# Patient Record
Sex: Male | Born: 1980 | Race: Black or African American | Hispanic: No | Marital: Married | Smoking: Never smoker
Health system: Southern US, Community
[De-identification: ages and names within clinical notes are randomized; demographics above are authoritative.]

## PROBLEM LIST (undated history)

## (undated) DIAGNOSIS — K2100 Gastro-esophageal reflux disease with esophagitis, without bleeding: Secondary | ICD-10-CM

## (undated) DIAGNOSIS — A549 Gonococcal infection, unspecified: Secondary | ICD-10-CM

## (undated) DIAGNOSIS — U071 COVID-19: Secondary | ICD-10-CM

---

## 2020-02-08 DIAGNOSIS — Z20822 Contact with and (suspected) exposure to covid-19: Secondary | ICD-10-CM | POA: Diagnosis not present

## 2021-02-16 ENCOUNTER — Other Ambulatory Visit: Payer: Self-pay

## 2021-02-16 ENCOUNTER — Encounter (HOSPITAL_COMMUNITY): Payer: Self-pay

## 2021-02-16 ENCOUNTER — Ambulatory Visit (HOSPITAL_COMMUNITY)
Admission: EM | Admit: 2021-02-16 | Discharge: 2021-02-16 | Disposition: A | Discharge status: Home / Self Care | Payer: 59 | Attending: Internal Medicine | Admitting: Internal Medicine

## 2021-02-16 DIAGNOSIS — M79652 Pain in left thigh: Secondary | ICD-10-CM

## 2021-02-16 DIAGNOSIS — R5383 Other fatigue: Secondary | ICD-10-CM | POA: Insufficient documentation

## 2021-02-16 DIAGNOSIS — R300 Dysuria: Secondary | ICD-10-CM

## 2021-02-16 DIAGNOSIS — M7632 Iliotibial band syndrome, left leg: Secondary | ICD-10-CM | POA: Diagnosis present

## 2021-02-16 DIAGNOSIS — M79651 Pain in right thigh: Secondary | ICD-10-CM | POA: Insufficient documentation

## 2021-02-16 LAB — POCT URINALYSIS DIPSTICK, ED / UC
Bilirubin Urine: NEGATIVE
Glucose, UA: NEGATIVE mg/dL
Hgb urine dipstick: NEGATIVE
Ketones, ur: NEGATIVE mg/dL
Leukocytes,Ua: NEGATIVE
Nitrite: NEGATIVE
Protein, ur: NEGATIVE mg/dL
Specific Gravity, Urine: 1.02 (ref 1.005–1.030)
Urobilinogen, UA: 0.2 mg/dL (ref 0.0–1.0)
pH: 7 (ref 5.0–8.0)

## 2021-02-16 MED ORDER — NAPROXEN 500 MG PO TABS: 500.0000 mg | ORAL_TABLET | Freq: Two times a day (BID) | ORAL | 0 refills | Status: DC

## 2021-02-16 NOTE — ED Provider Notes (Signed)
[facility]   MRN: [id number] DOB: [birth year]  Subjective:   Hayden Snyder is a 40 y.o. male presenting for 1 year history of acute onset dysuria, occasional urinary straining.  Denies fever, hematuria, urinary frequency, urinary urgency.  Patient does not hydrate consistently.  He does drink about 3 cups of coffee daily.  No alcohol use.  No drug use.  No smoking.  Patient is sexually active with his wife only. Would like to have STI testing to make sure. His paternal grandfather had prostate problems. Patient also has persistent bilateral knee pain, thigh pain worse over left lateral thigh. 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  Denies any falls, trauma, swelling, bruising, history of arthritis or musculoskeletal disorders.  He is also had longstanding history of fatigue and weakness.  Does not have a PCP.  No current facility-administered medications for this encounter. No current outpatient medications on file.   No Known Allergies  History reviewed. No pertinent past medical history.   History reviewed. No pertinent surgical history.  Family History  Problem Relation Age of Onset   Hypertension Father    Diabetes Father     Social History   Tobacco Use   Smoking status: Never   Smokeless tobacco: Never  Substance Use Topics   Alcohol use: Not Currently   Drug use: Not Currently    ROS   Objective:   Vitals: BP 123/67 (BP Location: Right Arm)   Pulse (!) 57   Temp 98.5 F (36.9 C) (Oral)   Resp 18   SpO2 100%   Physical Exam Constitutional:      General: He is not in acute distress.    Appearance: Normal appearance. He is well-developed and normal weight. He is not ill-appearing, toxic-appearing or diaphoretic.  HENT:     Head: Normocephalic and atraumatic.     Right Ear: External ear normal.     Left Ear: External ear normal.     Nose: Nose normal.      Mouth/Throat:     Pharynx: Oropharynx is clear.  Eyes:     General: No scleral icterus.       Right eye: No discharge.        Left eye: No discharge.     Extraocular Movements: Extraocular movements intact.     Pupils: Pupils are equal, round, and reactive to light.  Cardiovascular:     Rate and Rhythm: Normal rate.  Pulmonary:     Effort: Pulmonary effort is normal.  Musculoskeletal:     Cervical back: Normal range of motion.  Skin:    General: Skin is warm and dry.  Neurological:     Mental Status: He is alert and oriented to person, place, and time.     Motor: No weakness.     Coordination: Coordination normal.     Gait: Gait normal.     Deep Tendon Reflexes: Reflexes normal.  Psychiatric:        Mood and Affect: Mood normal.        Behavior: Behavior normal.        Thought Content: Thought content normal.        Judgment: Judgment normal.    Results for orders placed or performed during the hospital encounter of 02/16/21 (from the past 24 hour(s))  POCT Urinalysis Dipstick (ED/UC)     Status: None   Collection Time:  02/16/21  7:08 PM  Result Value Ref Range   Glucose, UA NEGATIVE NEGATIVE mg/dL   Bilirubin Urine NEGATIVE NEGATIVE   Ketones, ur NEGATIVE NEGATIVE mg/dL   Specific Gravity, Urine 1.020 1.005 - 1.030   Hgb urine dipstick NEGATIVE NEGATIVE   pH 7.0 5.0 - 8.0   Protein, ur NEGATIVE NEGATIVE mg/dL   Urobilinogen, UA 0.2 0.0 - 1.0 mg/dL   Nitrite NEGATIVE NEGATIVE   Leukocytes,Ua NEGATIVE NEGATIVE    Assessment and Plan :   PDMP not reviewed this encounter.  1. Dysuria   2. Fatigue, unspecified type   3. Bilateral thigh pain   4. It band syndrome, left     Hold off on starting Flomax for BPH.  Recommended hydrating much more consistently with plain water, 2 L daily and cutting back on his coffee.  Needs follow-up with a new PCP, placed into the PCP assistance program.  Recommended naproxen for pain and inflammation, IT band syndrome secondary to the  nature of his work.  He will need labs and work-up for chronic fatigue and weakness.  Vital signs and physical exam findings stable for discharge. Counseled patient on potential for adverse effects with medications prescribed/recommended today, ER and return-to-clinic precautions discussed, patient verbalized understanding.    Wallis Bamberg, [place] 02/16/21 1017

## 2021-02-16 NOTE — ED Triage Notes (Addendum)
Pt c/o burning with urination and fatigue, with pain in both knees. Pt feels as though he has urinary obstruction. Reports sensation of incomplete bladder emptying.   Dysuria/retention started approx.one year ago per pt, knee pain began 5 months ago. Reports that the pain in the left knee extends up to thigh.

## 2021-02-17 LAB — CYTOLOGY, (ORAL, ANAL, URETHRAL) ANCILLARY ONLY
Chlamydia: NEGATIVE
Comment: NEGATIVE
Comment: NEGATIVE
Comment: NORMAL
Neisseria Gonorrhea: NEGATIVE
Trichomonas: NEGATIVE

## 2021-02-18 LAB — URINE CULTURE: Culture: <10000 — AB

## 2021-09-30 ENCOUNTER — Encounter (HOSPITAL_BASED_OUTPATIENT_CLINIC_OR_DEPARTMENT_OTHER): Payer: Self-pay | Admitting: Emergency Medicine

## 2021-09-30 ENCOUNTER — Emergency Department (HOSPITAL_BASED_OUTPATIENT_CLINIC_OR_DEPARTMENT_OTHER)
Admission: EM | Admit: 2021-09-30 | Discharge: 2021-09-30 | Disposition: A | Discharge status: Home / Self Care | Payer: Self-pay | Attending: Emergency Medicine | Admitting: Emergency Medicine

## 2021-09-30 ENCOUNTER — Other Ambulatory Visit: Payer: Self-pay

## 2021-09-30 ENCOUNTER — Emergency Department (HOSPITAL_BASED_OUTPATIENT_CLINIC_OR_DEPARTMENT_OTHER): Payer: Self-pay

## 2021-09-30 DIAGNOSIS — R338 Other retention of urine: Secondary | ICD-10-CM | POA: Diagnosis not present

## 2021-09-30 DIAGNOSIS — R109 Unspecified abdominal pain: Secondary | ICD-10-CM | POA: Insufficient documentation

## 2021-09-30 DIAGNOSIS — R339 Retention of urine, unspecified: Secondary | ICD-10-CM | POA: Insufficient documentation

## 2021-09-30 DIAGNOSIS — R350 Frequency of micturition: Secondary | ICD-10-CM | POA: Insufficient documentation

## 2021-09-30 DIAGNOSIS — R3911 Hesitancy of micturition: Secondary | ICD-10-CM | POA: Insufficient documentation

## 2021-09-30 DIAGNOSIS — R300 Dysuria: Secondary | ICD-10-CM | POA: Insufficient documentation

## 2021-09-30 LAB — URINALYSIS, ROUTINE W REFLEX MICROSCOPIC
Bilirubin Urine: NEGATIVE
Glucose, UA: NEGATIVE mg/dL
Hgb urine dipstick: NEGATIVE
Ketones, ur: NEGATIVE mg/dL
Leukocytes,Ua: NEGATIVE
Nitrite: NEGATIVE
Protein, ur: NEGATIVE mg/dL
Specific Gravity, Urine: 1.01 (ref 1.005–1.030)
pH: 5.5 (ref 5.0–8.0)

## 2021-09-30 LAB — CBC WITH DIFFERENTIAL/PLATELET
Abs Immature Granulocytes: 0.02 10*3/uL (ref 0.00–0.07)
Basophils Absolute: 0 10*3/uL (ref 0.0–0.1)
Basophils Relative: 0 %
Eosinophils Absolute: 0.2 10*3/uL (ref 0.0–0.5)
Eosinophils Relative: 2 %
HCT: 44.4 % (ref 39.0–52.0)
Hemoglobin: 14.4 g/dL (ref 13.0–17.0)
Immature Granulocytes: 0 %
Lymphocytes Relative: 21 %
Lymphs Abs: 1.7 10*3/uL (ref 0.7–4.0)
MCH: 29.4 pg (ref 26.0–34.0)
MCHC: 32.4 g/dL (ref 30.0–36.0)
MCV: 90.6 fL (ref 80.0–100.0)
Monocytes Absolute: 0.4 10*3/uL (ref 0.1–1.0)
Monocytes Relative: 5 %
Neutro Abs: 5.9 10*3/uL (ref 1.7–7.7)
Neutrophils Relative %: 72 %
Platelets: 164 10*3/uL (ref 150–400)
RBC: 4.9 MIL/uL (ref 4.22–5.81)
RDW: 13 % (ref 11.5–15.5)
WBC: 8.3 10*3/uL (ref 4.0–10.5)
nRBC: 0 % (ref 0.0–0.2)

## 2021-09-30 LAB — COMPREHENSIVE METABOLIC PANEL
ALT: 15 U/L (ref 0–44)
AST: 20 U/L (ref 15–41)
Albumin: 4.1 g/dL (ref 3.5–5.0)
Alkaline Phosphatase: 58 U/L (ref 38–126)
Anion gap: 8 (ref 5–15)
BUN: 10 mg/dL (ref 6–20)
CO2: 25 mmol/L (ref 22–32)
Calcium: 8.9 mg/dL (ref 8.9–10.3)
Chloride: 105 mmol/L (ref 98–111)
Creatinine, Ser: 0.78 mg/dL (ref 0.61–1.24)
GFR, Estimated: >60 mL/min (ref >60)
Glucose, Bld: 84 mg/dL (ref 70–99)
Potassium: 3.9 mmol/L (ref 3.5–5.1)
Sodium: 138 mmol/L (ref 135–145)
Total Bilirubin: 0.5 mg/dL (ref 0.3–1.2)
Total Protein: 7 g/dL (ref 6.5–8.1)

## 2021-09-30 MED ORDER — TAMSULOSIN HCL 0.4 MG PO CAPS: 0.4000 mg | ORAL_CAPSULE | Freq: Every day | ORAL | 0 refills | Status: AC

## 2021-09-30 MED ORDER — CEPHALEXIN 500 MG PO CAPS: 500.0000 mg | ORAL_CAPSULE | Freq: Two times a day (BID) | ORAL | 0 refills | Status: AC

## 2021-09-30 MED ORDER — OXYBUTYNIN CHLORIDE 5 MG PO TABS: 5.0000 mg | ORAL_TABLET | Freq: Three times a day (TID) | ORAL | 0 refills | Status: DC | PRN

## 2021-09-30 NOTE — ED Triage Notes (Signed)
Pt bladder scanned for 343 cc.

## 2021-09-30 NOTE — ED Notes (Signed)
Pt verbalizes understanding of discharge instructions. Opportunity for questioning and answers were provided. Pt discharged from ED to home. Discharged using interpreter ID [phone]

## 2021-09-30 NOTE — ED Provider Notes (Signed)
[facility] Provider Note   CSN: [id number] Arrival date & time: 09/30/21  1611     History  Chief Complaint  Patient presents with   Urinary Retention    Hayden Snyder is a 41 y.o. male.  The history is provided by the patient.  Male GU Problem Presenting symptoms: dysuria   Context: spontaneously   Worsened by:  Nothing Associated symptoms: urinary frequency, urinary hesitation and urinary retention   Associated symptoms: no abdominal pain, no diarrhea, no fever, no flank pain, no genital itching, no genital lesions, no genital rash, no groin pain, no hematuria, no nausea, no penile redness, no penile swelling, no priapism, no scrotal swelling, no urinary incontinence and no vomiting   Risk factors: no kidney stones and no urinary catheter       Home Medications Prior to Admission medications   Medication Sig Start Date End Date Taking? Authorizing Provider  cephALEXin (KEFLEX) 500 MG capsule Take 1 capsule (500 mg total) by mouth 2 (two) times daily for 5 days. 09/30/21 10/05/21 Yes Reginna Sermeno, DO  oxybutynin (DITROPAN) 5 MG tablet Take 1 tablet (5 mg total) by mouth every 8 (eight) hours as needed for up to 15 doses for bladder spasms. 09/30/21  Yes Ashanna Heinsohn, DO  tamsulosin (FLOMAX) 0.4 MG CAPS capsule Take 1 capsule (0.4 mg total) by mouth daily for 7 days. 09/30/21 10/07/21 Yes Cayden Rautio, DO  naproxen (NAPROSYN) 500 MG tablet Take 1 tablet (500 mg total) by mouth 2 (two) times daily with a meal. 02/16/21   Jaynee Eagles, PA-C      Allergies    Patient has no known allergies.    Review of Systems   Review of Systems  Constitutional:  Negative for fever.  Gastrointestinal:  Negative for abdominal pain, diarrhea, nausea and vomiting.  Genitourinary:  Positive for dysuria, frequency and hesitancy. Negative for bladder incontinence, flank pain, hematuria, penile swelling and scrotal swelling.   Physical Exam Updated Vital  Signs BP 104/76    Pulse 79    Temp 97.9 F (36.6 C)    Resp 18    SpO2 97%  Physical Exam Vitals and nursing note reviewed.  Constitutional:      General: He is not in acute distress.    Appearance: He is well-developed. He is not ill-appearing.  HENT:     Head: Normocephalic and atraumatic.     Nose: Nose normal.     Mouth/Throat:     Mouth: Mucous membranes are moist.  Eyes:     Extraocular Movements: Extraocular movements intact.     Conjunctiva/sclera: Conjunctivae normal.     Pupils: Pupils are equal, round, and reactive to light.  Cardiovascular:     Rate and Rhythm: Normal rate and regular rhythm.     Pulses: Normal pulses.     Heart sounds: Normal heart sounds. No murmur heard. Pulmonary:     Effort: Pulmonary effort is normal. No respiratory distress.     Breath sounds: Normal breath sounds.  Abdominal:     General: Abdomen is flat.     Palpations: Abdomen is soft.     Tenderness: There is abdominal tenderness.  Musculoskeletal:        General: No swelling.     Cervical back: Neck supple.  Skin:    General: Skin is warm and dry.     Capillary Refill: Capillary refill takes less than 2 seconds.  Neurological:     General: No focal deficit  present.     Mental Status: He is alert and oriented to person, place, and time.     Cranial Nerves: No cranial nerve deficit.     Sensory: No sensory deficit.     Motor: No weakness.     Coordination: Coordination normal.  Psychiatric:        Mood and Affect: Mood normal.    ED Results / Procedures / Treatments   Labs (all labs ordered are listed, but only abnormal results are displayed) Labs Reviewed  URINE CULTURE  URINALYSIS, ROUTINE W REFLEX MICROSCOPIC  CBC WITH DIFFERENTIAL/PLATELET  COMPREHENSIVE METABOLIC PANEL    EKG None  Radiology CT Renal Stone Study  Result Date: 09/30/2021 CLINICAL DATA:  Urinary retention. EXAM: CT ABDOMEN AND PELVIS WITHOUT CONTRAST TECHNIQUE: Multidetector CT imaging of the  abdomen and pelvis was performed following the standard protocol without IV contrast. RADIATION DOSE REDUCTION: This exam was performed according to the departmental dose-optimization program which includes automated exposure control, adjustment of the mA and/or kV according to patient size and/or use of iterative reconstruction technique. COMPARISON:  None. FINDINGS: Lower chest: No acute abnormality. Evaluation of the abdominal viscera is limited by the lack of IV contrast. Hepatobiliary: No focal liver abnormality is seen. Normal appearance of the gallbladder. Pancreas: Unremarkable. No surrounding inflammatory changes. Spleen: Normal in size without focal abnormality. Adrenals/Urinary Tract: Adrenal glands are unremarkable. Kidneys are normal, without renal calculi, focal lesion, or hydronephrosis. Bladder is unremarkable. Stomach/Bowel: Stomach is within normal limits. Appendix appears normal. No evidence of bowel wall thickening, distention, or inflammatory changes. Vascular/Lymphatic: No enlarged abdominal or pelvic lymph nodes. Reproductive: Prostate is unremarkable. Other: No abdominal wall hernia or abnormality. No abdominopelvic ascites. Musculoskeletal: Bilateral L5 pars defects.  No listhesis. IMPRESSION: 1. No acute process in the abdomen or pelvis on a noncontrast exam. 2. 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 Electronically Signed   By: Audie Pinto M.D.   On: 09/30/2021 18:14    Procedures Procedures    Medications Ordered in ED Medications - No data to display  ED Course/ Medical Decision Making/ A&P                           Medical Decision Making Amount and/or Complexity of Data Reviewed Labs: ordered. Radiology: ordered.  Risk Prescription drug management.   Hayden Snyder is here with pain with urination, difficulty urinating.  Normal vitals.  No fever.  No significant medical history.  Patient with difficulty with urination over the last day or 2  with burning sensation.  Denies any discharge or hematuria.  He had a bladder scan done in triage that showed around 300 cc of urine but postvoid residual with me showed less than 100 cc.  He is concerned about possibly prostate issue.  He is not having any back pain or any symptoms of cauda equina or stroke symptoms.  Neurologically he is intact.  He has some mild abdominal tenderness in the suprapubic region.  Overall this has never happened to him before.  Differential diagnosis includes cystitis versus bladder outlet obstruction from something such as BPH or mass.  We will get basic labs including CT scan of abdomen and pelvis.  Will check urinalysis.  Lab work was reviewed and interpreted.  There is no significant anemia, electrolyte abnormality, kidney injury.  Urinalysis does not appear to be consistent with UTI.  CT scan shows no obstructive process.  Prostate is not enlarged.  Neurologically he is intact.  I am not concerned for spinal cord problem or stroke.  He has no weakness or numbness throughout.  Overall he appears to be emptying his bladder well while being here but still has frequency type symptoms.  We will treat with oxybutynin, Flomax, Keflex and have him follow-up with urology if symptoms are persistent.  At this time he does not have any evidence of true retention and will hold off on Foley catheter but he understands return precautions.  Discharged in good condition.  Given information to follow-up with urology outpatient.  This chart was dictated using voice recognition software.  Despite best efforts to proofread,  errors can occur which can change the documentation meaning.         Final Clinical Impression(s) / ED Diagnoses Final diagnoses:  Urinary frequency    Rx / DC Orders ED Discharge Orders          Ordered    tamsulosin (FLOMAX) 0.4 MG CAPS capsule  Daily        09/30/21 1949    oxybutynin (DITROPAN) 5 MG tablet  Every 8 hours PRN        09/30/21 1949     cephALEXin (KEFLEX) 500 MG capsule  2 times daily        09/30/21 1949              Lennice Sites, DO 09/30/21 1952

## 2021-09-30 NOTE — ED Notes (Signed)
Pt states through interpreter that he has has painful urination for may years. Pt currently denis any pain except when he urinates which the last time was this morning.

## 2021-09-30 NOTE — Discharge Instructions (Addendum)
Take medications as prescribed.  Follow-up with urologist.  Please return if symptoms worsen as discussed.

## 2021-09-30 NOTE — ED Triage Notes (Signed)
Pt states he hasnt voided since yesterday. In severe pain at this time. This has never happened before. Pt denies any medical history. Used Interpreter Ipad , Arabic.

## 2021-10-02 LAB — URINE CULTURE: Culture: NO GROWTH

## 2021-10-05 DIAGNOSIS — R350 Frequency of micturition: Secondary | ICD-10-CM | POA: Diagnosis not present

## 2021-10-05 DIAGNOSIS — R3912 Poor urinary stream: Secondary | ICD-10-CM | POA: Diagnosis not present

## 2021-10-05 DIAGNOSIS — R102 Pelvic and perineal pain: Secondary | ICD-10-CM | POA: Diagnosis not present

## 2021-11-06 DIAGNOSIS — R3912 Poor urinary stream: Secondary | ICD-10-CM | POA: Diagnosis not present

## 2021-11-06 DIAGNOSIS — R102 Pelvic and perineal pain: Secondary | ICD-10-CM | POA: Diagnosis not present

## 2021-11-06 DIAGNOSIS — R350 Frequency of micturition: Secondary | ICD-10-CM | POA: Diagnosis not present

## 2022-02-09 DIAGNOSIS — R3912 Poor urinary stream: Secondary | ICD-10-CM | POA: Diagnosis not present

## 2022-02-09 DIAGNOSIS — N35011 Post-traumatic bulbous urethral stricture: Secondary | ICD-10-CM | POA: Diagnosis not present

## 2022-02-19 ENCOUNTER — Other Ambulatory Visit: Payer: Self-pay | Admitting: Urology

## 2022-02-26 ENCOUNTER — Encounter (HOSPITAL_BASED_OUTPATIENT_CLINIC_OR_DEPARTMENT_OTHER): Payer: Self-pay | Admitting: Urology

## 2022-02-26 ENCOUNTER — Other Ambulatory Visit: Payer: Self-pay

## 2022-02-26 NOTE — Progress Notes (Signed)
Spoke w/ via phone for pre-op interview---pt and arabic interpreter Lab needs dos---- none              Lab results------none COVID test -----patient states asymptomatic no test needed Arrive at -------0530 NPO after MN NO Solid Food.  Clear liquids from MN until--- Med rec completed Medications to take morning of surgery -----none Diabetic medication -----n/a Patient instructed no nail polish to be worn day of surgery Patient instructed to bring photo id and insurance card day of surgery Patient aware to have Driver (ride ) / caregiver   will let know day of surgery  for 24 hours after surgery  Patient Special Instructions -----no food and drinks after midnight Pre-Op special Istructions ----- Patient verbalized understanding of instructions that were given at this phone interview. Patient denies shortness of breath, chest pain, fever, cough at this phone interview.    Used arabic interpreter # [id number]  Pt doesn't know who will pick him up or stay with him 24 hrs after surgery, told if he doesn't have anyone his surgery will be cancelled, told him this twice by interpreter.    Interpreter requested DOS

## 2022-03-01 NOTE — H&P (Signed)
Office Visit Report     02/09/2022   --------------------------------------------------------------------------------   Hayden Snyder  MRN: [id number]  DOB: 01-17-1981, 41 year old Male  SSN:    PRIMARY CARE:    REFERRING:    PROVIDER:  Jerilee Field, M.D.  LOCATION:  [facility] [phone]     --------------------------------------------------------------------------------   CC/HPI: F/u -    1) BPH - LUTS - he had SP discomfort starting Feb 2023. He had this years ago. He had urinary hesitancy and weak stream. He went to [facility] September 30, 2021. Urine was clear. Urine culture was negative. White count 8.3, creatinine 0.78. CT scan of the abdomen and pelvis was done September 30, 2021 showed a benign GU tract without bladder distention. no hydro. He started cephalexin, tamsulosin and oxybutynin. He has frequency for many years. Tams and oxyb have helped some.   He had "bladder" pain again today - SP area. He has left leg/hip pain. Radiates down the leg - like "sciatica". He does factory work and stands a lot. No constipation. No prior GU surgery. No NG risk.   PVR Feb 2023 73 ml. He was on tamsulosin. He was prescribed oxybutynin and NSAIDs in February 2023. His UA is clear. His dysuria is better. He continues to have weak stream and mild dysuria. PVR 28 ml. UA clear. He elects to proceed with cystoscopy.     ALLERGIES: No Allergies    MEDICATIONS: Oxybutynin Chloride 5 mg tablet 1 tablet PO Q AM  Oxybutynin Chloride 5 mg tablet  Tamsulosin Hcl 0.4 mg capsule 1 capsule PO Q HS  Tamsulosin Hcl 0.4 mg capsule     GU PSH: No GU PSH    NON-GU PSH: No Non-GU PSH    GU PMH: Pelvic/perineal pain - 11/06/2021, Do a short course of meloxicam. Your CT, labs, exam and normal. Likely MSK. I recommended he find a PCP so that he can establish. He may need more imaging in future or referral to orthopedics. , - 10/05/2021 Urinary Frequency - 11/06/2021, continue  tamsulosin QHS and oxybutynin QAM. Consider a variety of treatments - PT, may need UDS and cystoscopy - discussed the role of these procedures. , - 10/05/2021 Weak Urinary Stream - 11/06/2021, - 10/05/2021    NON-GU PMH: No Non-GU PMH    FAMILY HISTORY: No Family History    SOCIAL HISTORY: No Social History    REVIEW OF SYSTEMS:    GU Review Male:   Patient denies frequent urination, hard to postpone urination, burning/ pain with urination, get up at night to urinate, leakage of urine, stream starts and stops, trouble starting your stream, have to strain to urinate , erection problems, and penile pain.  Gastrointestinal (Upper):   Patient denies nausea, vomiting, and indigestion/ heartburn.  Gastrointestinal (Lower):   Patient denies diarrhea and constipation.  Constitutional:   Patient denies fever, night sweats, weight loss, and fatigue.  Skin:   Patient denies skin rash/ lesion and itching.  Eyes:   Patient denies blurred vision and double vision.  Ears/ Nose/ Throat:   Patient denies sore throat and sinus problems.  Hematologic/Lymphatic:   Patient denies swollen glands and easy bruising.  Cardiovascular:   Patient denies chest pains and leg swelling.  Respiratory:   Patient denies cough and shortness of breath.  Endocrine:   Patient denies excessive thirst.  Musculoskeletal:   Patient denies back pain and joint pain.  Neurological:   Patient denies headaches and dizziness.  Psychologic:  Patient denies depression and anxiety.   VITAL SIGNS: None   GU PHYSICAL EXAMINATION:    Scrotum: No lesions. No edema. No cysts. No warts.  Urethral Meatus: Normal size. No lesion, no wart, no discharge, no polyp. Normal location.  Penis: Circumcised, no warts, no cracks. No dorsal Peyronie's plaques, no left corporal Peyronie's plaques, no right corporal Peyronie's plaques, no scarring, no warts. No balanitis, no meatal stenosis.   MULTI-SYSTEM PHYSICAL EXAMINATION:    Constitutional:  Well-nourished. No physical deformities. Normally developed. Good grooming.  Neck: Neck symmetrical, not swollen. Normal tracheal position.  Respiratory: No labored breathing, no use of accessory muscles.   Cardiovascular: Normal temperature, normal extremity pulses, no swelling, no varicosities.  Skin: No paleness, no jaundice, no cyanosis. No lesion, no ulcer, no rash.  Neurologic / Psychiatric: Oriented to time, oriented to place, oriented to person. No depression, no anxiety, no agitation.  Gastrointestinal: No mass, no tenderness, no rigidity, non obese abdomen.     PAST DATA REVIEW: None   PROCEDURES:         Flexible Cystoscopy - 52000  Risks, benefits, and some of the potential complications of the procedure were discussed with the patient. All questions were answered. Informed consent was obtained. Antibiotic prophylaxis was given -- Cephalexin. Sterile technique and intraurethral analgesia were used.  Meatus:  Normal size. Normal location. Normal condition.  Urethra:  Severe bulbous stricture - 8 Fr. I can see through it it seems its about a centimeter long. I showed Ky the stricture on the screen compared to the normal caliber urethra.       The lower urinary tract was carefully examined. The procedure was well-tolerated and without complications. Antibiotic instructions were given. Instructions were given to call the office immediately for bloody urine, difficulty urinating, painful urination, fever, chills, nausea, vomiting or other illness. The patient stated that he understood these instructions and would comply with them.        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 We discussed continued surveillance as his urine is clear and he is getting his bladder empty. We discussed formal urethroplasty. All questions answered and he will proceed with balloon dilation. We discussed postop Foley.  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 We discussed this may not alleviate his symptoms but I was suspicious of something like this given his description.   PLAN:            Medications Stop Meds: Cephalexin 500 mg capsule  Discontinue: 02/09/2022  - Reason: The medication cycle was completed.            Schedule Return Visit/Planned Activity: Next Available Appointment - Schedule Surgery          Document Letter(s):  Created for Patient: Clinical Summary    * Signed by Jerilee Field, M.D. on 02/12/22 at 12:45 PM (EDT)*  The information contained in this medical record document is considered private and confidential patient information. This information can only be used for the medical diagnosis and/or medical services that are being provided by the patient's selected caregivers. This information can only be distributed outside of the patient's care if the patient agrees and signs waivers of authorization for this information to be sent to an outside source or route.

## 2022-03-02 ENCOUNTER — Encounter (HOSPITAL_BASED_OUTPATIENT_CLINIC_OR_DEPARTMENT_OTHER): Payer: Self-pay | Admitting: Urology

## 2022-03-02 ENCOUNTER — Encounter (HOSPITAL_BASED_OUTPATIENT_CLINIC_OR_DEPARTMENT_OTHER)
Admission: RE | Disposition: A | Discharge status: Home / Self Care | Payer: Self-pay | Source: Home / Self Care | Attending: Urology

## 2022-03-02 ENCOUNTER — Ambulatory Visit (HOSPITAL_BASED_OUTPATIENT_CLINIC_OR_DEPARTMENT_OTHER)
Admission: RE | Admit: 2022-03-02 | Discharge: 2022-03-02 | Disposition: A | Discharge status: Home / Self Care | Payer: BC Managed Care – PPO | Attending: Urology | Admitting: Urology

## 2022-03-02 ENCOUNTER — Ambulatory Visit (HOSPITAL_BASED_OUTPATIENT_CLINIC_OR_DEPARTMENT_OTHER): Payer: BC Managed Care – PPO | Admitting: Anesthesiology

## 2022-03-02 DIAGNOSIS — Z79899 Other long term (current) drug therapy: Secondary | ICD-10-CM | POA: Diagnosis not present

## 2022-03-02 DIAGNOSIS — R350 Frequency of micturition: Secondary | ICD-10-CM | POA: Insufficient documentation

## 2022-03-02 DIAGNOSIS — R3912 Poor urinary stream: Secondary | ICD-10-CM | POA: Insufficient documentation

## 2022-03-02 DIAGNOSIS — N35812 Other urethral bulbous stricture, male: Secondary | ICD-10-CM

## 2022-03-02 DIAGNOSIS — N401 Enlarged prostate with lower urinary tract symptoms: Secondary | ICD-10-CM | POA: Insufficient documentation

## 2022-03-02 DIAGNOSIS — N35919 Unspecified urethral stricture, male, unspecified site: Secondary | ICD-10-CM | POA: Diagnosis not present

## 2022-03-02 HISTORY — DX: Gonococcal infection, unspecified: A54.9

## 2022-03-02 HISTORY — DX: COVID-19: U07.1

## 2022-03-02 HISTORY — PX: BALLOON DILATION: SHX5330

## 2022-03-02 HISTORY — DX: Gastro-esophageal reflux disease with esophagitis, without bleeding: K21.00

## 2022-03-02 SURGERY — BALLOON DILATION
Anesthesia: General | Site: Urethra

## 2022-03-02 MED ORDER — CEFAZOLIN SODIUM-DEXTROSE 2-4 GM/100ML-% IV SOLN
INTRAVENOUS | Status: AC
  Filled 2022-03-02: qty 100

## 2022-03-02 MED ORDER — OXYBUTYNIN CHLORIDE 5 MG PO TABS: 5.0000 mg | ORAL_TABLET | Freq: Three times a day (TID) | ORAL | 0 refills | Status: DC | PRN

## 2022-03-02 MED ORDER — STERILE WATER FOR IRRIGATION IR SOLN
Status: DC | PRN
  Administered 2022-03-02: 500 mL

## 2022-03-02 MED ORDER — MIDAZOLAM HCL 2 MG/2ML IJ SOLN
INTRAMUSCULAR | Status: DC | PRN
  Administered 2022-03-02: 2 mg via INTRAVENOUS

## 2022-03-02 MED ORDER — PROPOFOL 10 MG/ML IV BOLUS
INTRAVENOUS | Status: AC
  Filled 2022-03-02: qty 20

## 2022-03-02 MED ORDER — OXYCODONE HCL 5 MG PO TABS
ORAL_TABLET | ORAL | Status: AC
  Filled 2022-03-02: qty 1

## 2022-03-02 MED ORDER — ONDANSETRON HCL 4 MG/2ML IJ SOLN
INTRAMUSCULAR | Status: DC | PRN
  Administered 2022-03-02: 4 mg via INTRAVENOUS

## 2022-03-02 MED ORDER — LIDOCAINE 2% (20 MG/ML) 5 ML SYRINGE
INTRAMUSCULAR | Status: DC | PRN
  Administered 2022-03-02: 60 mg via INTRAVENOUS

## 2022-03-02 MED ORDER — AMISULPRIDE (ANTIEMETIC) 5 MG/2ML IV SOLN: 10.0000 mg | Freq: Once | INTRAVENOUS | Status: DC | PRN

## 2022-03-02 MED ORDER — OXYCODONE HCL 5 MG PO TABS
5.0000 mg | ORAL_TABLET | Freq: Once | ORAL | Status: AC | PRN
  Administered 2022-03-02: 5 mg via ORAL

## 2022-03-02 MED ORDER — HYDROCODONE-ACETAMINOPHEN 5-325 MG PO TABS
1.0000 | ORAL_TABLET | ORAL | 0 refills | Status: AC | PRN
Start: 2022-03-02 — End: 2023-03-02

## 2022-03-02 MED ORDER — HYDROMORPHONE HCL 1 MG/ML IJ SOLN: 0.2500 mg | INTRAMUSCULAR | Status: DC | PRN

## 2022-03-02 MED ORDER — CEFAZOLIN SODIUM-DEXTROSE 2-4 GM/100ML-% IV SOLN
2.0000 g | INTRAVENOUS | Status: AC
  Administered 2022-03-02: 2 g via INTRAVENOUS

## 2022-03-02 MED ORDER — CEPHALEXIN 500 MG PO CAPS: 500.0000 mg | ORAL_CAPSULE | Freq: Every day | ORAL | 0 refills | Status: DC

## 2022-03-02 MED ORDER — MEPERIDINE HCL 25 MG/ML IJ SOLN: 6.2500 mg | INTRAMUSCULAR | Status: DC | PRN

## 2022-03-02 MED ORDER — OXYCODONE HCL 5 MG/5ML PO SOLN: 5.0000 mg | Freq: Once | ORAL | Status: AC | PRN

## 2022-03-02 MED ORDER — FENTANYL CITRATE (PF) 100 MCG/2ML IJ SOLN
INTRAMUSCULAR | Status: AC
  Filled 2022-03-02: qty 2

## 2022-03-02 MED ORDER — MIDAZOLAM HCL 2 MG/2ML IJ SOLN
INTRAMUSCULAR | Status: AC
  Filled 2022-03-02: qty 2

## 2022-03-02 MED ORDER — FENTANYL CITRATE (PF) 250 MCG/5ML IJ SOLN
INTRAMUSCULAR | Status: DC | PRN
  Administered 2022-03-02: 50 ug via INTRAVENOUS

## 2022-03-02 MED ORDER — IOHEXOL 300 MG/ML  SOLN
INTRAMUSCULAR | Status: DC | PRN
  Administered 2022-03-02: 50 mL

## 2022-03-02 MED ORDER — PROPOFOL 10 MG/ML IV BOLUS
INTRAVENOUS | Status: DC | PRN
  Administered 2022-03-02: 200 mg via INTRAVENOUS

## 2022-03-02 MED ORDER — DEXAMETHASONE SODIUM PHOSPHATE 10 MG/ML IJ SOLN
INTRAMUSCULAR | Status: DC | PRN
  Administered 2022-03-02: 10 mg via INTRAVENOUS

## 2022-03-02 MED ORDER — PROMETHAZINE HCL 25 MG/ML IJ SOLN: 6.2500 mg | INTRAMUSCULAR | Status: DC | PRN

## 2022-03-02 MED ORDER — LACTATED RINGERS IV SOLN: INTRAVENOUS | Status: DC

## 2022-03-02 SURGICAL SUPPLY — 26 items
BAG DRAIN URO-CYSTO SKYTR STRL (DRAIN) ×2 IMPLANT
BAG URINE DRAIN 2000ML AR STRL (UROLOGICAL SUPPLIES) ×1 IMPLANT
BALLN NEPHROSTOMY (BALLOONS)
BALLN OPTILUME DCB 30X3X75 (BALLOONS)
BALLN OPTILUME DCB 30X5X75 (BALLOONS) ×2
BALLOON NEPHROSTOMY (BALLOONS) IMPLANT
BALLOON OPTILUME DCB 30X3X75 (BALLOONS) IMPLANT
BALLOON OPTILUME DCB 30X5X75 (BALLOONS) IMPLANT
CATH FOLEY 2W COUNCIL 5CC 16FR (CATHETERS) ×1 IMPLANT
CATH FOLEY 2W COUNCIL 5CC 18FR (CATHETERS) ×1 IMPLANT
CATH SET URETHRAL DILATOR (CATHETERS) ×2 IMPLANT
CLOTH BEACON ORANGE TIMEOUT ST (SAFETY) ×2 IMPLANT
ELECT REM PT RETURN 9FT ADLT (ELECTROSURGICAL)
ELECTRODE REM PT RTRN 9FT ADLT (ELECTROSURGICAL) IMPLANT
GLOVE BIO SURGEON STRL SZ7.5 (GLOVE) ×2 IMPLANT
GLOVE BIO SURGEON STRL SZ8 (GLOVE) IMPLANT
GOWN STRL REUS W/TWL LRG LVL3 (GOWN DISPOSABLE) ×2 IMPLANT
GUIDEWIRE ANG ZIPWIRE 038X150 (WIRE) IMPLANT
GUIDEWIRE STR DUAL SENSOR (WIRE) ×1 IMPLANT
HOLDER FOLEY CATH W/STRAP (MISCELLANEOUS) ×1 IMPLANT
IV NS IRRIG 3000ML ARTHROMATIC (IV SOLUTION) ×1 IMPLANT
KIT TURNOVER CYSTO (KITS) ×2 IMPLANT
MANIFOLD NEPTUNE II (INSTRUMENTS) ×2 IMPLANT
NS IRRIG 500ML POUR BTL (IV SOLUTION) ×1 IMPLANT
PACK CYSTO (CUSTOM PROCEDURE TRAY) ×2 IMPLANT
TUBE CONNECTING 12X1/4 (SUCTIONS) ×1 IMPLANT

## 2022-03-02 NOTE — Discharge Instructions (Addendum)
Urethral Dilation  Urethral dilation is a procedure to stretch open (dilate) the urethra. The urethra is the tube that drains urine from the bladder out of the body. In women, the urethra opens above the vaginal opening. In men, the urethra opens at the tip of the penis. Urethral dilation is usually done to treat narrowing of the urethra (urethral stricture), which can make it difficult to pass urine. Urethral dilation widens the urethra so that you can pass urine normally. Urethral dilation is done through the urethral opening. There are no incisions made during the procedure.  What can I expect after the procedure? After the procedure, it is common to have: Burning pain when urinating. Blood in your urine. A need to urinate frequently. You will be asked to urinate before you leave the hospital or clinic. Your urine flow should improve within a few days. Use a condom for 30 days during sexual intercourse and use other appropriate contraception to prevent pregnancy for 6 months after the procedure  Follow these instructions at home: Medicines Take over-the-counter and prescription medicines only as told by your health care provider. If you were prescribed an antibiotic medicine, take it as told by your health care provider. Do not stop taking the antibiotic even if you start to feel better. Ask your health care provider if the medicine prescribed to you: Requires you to avoid driving or using heavy machinery. Can cause constipation. You may need to take these actions to prevent or treat constipation: Take over-the-counter or prescription medicines. Eat foods that are high in fiber, such as beans, whole grains, and fresh fruits and vegetables. Limit foods that are high in fat and processed sugars, such as fried or sweet foods. General instructions Do not drive for 24 hours if you were given a sedative during your procedure. If you were sent home with a small, lubricated tube (catheter) to  help keep your urethra open, follow your health care provider's instructions about how and when to use it. Drink enough fluid to keep your urine pale yellow. Return to your normal activities as told by your health care provider. Ask your health care provider what activities are safe for you. Keep all follow-up visits as told by your health care provider. This is important. Contact a health care provider if: Your urine is cloudy and smells bad. You develop new bleeding when you urinate. You pass blood clots when you urinate. You have pain that does not get better with medicine. You have a fever. You have swelling, bruising, or discoloration of your genital area. This includes the penis, scrotum, and inner thighs for men, and the outer genital organs (vulva) and inner thighs for women. Get help right away if: You develop new bleeding that does not stop. You cannot pass urine. Summary Urethral dilation is a procedure to stretch open (dilate) the urethra. Urethral dilation is usually done to treat narrowing of the urethra (urethral stricture), which can make it difficult to pass urine. Ask your health care provider about changing or stopping your regular medicines before the procedure. After the procedure, it is common to have burning pain when urinating, blood in your urine, and a need to urinate frequently. This information is not intended to replace advice given to you by your health care provider. Make sure you discuss any questions you have with your health care provider. Document Revised: 09/18/2018 Document Reviewed: 09/18/2018 Elsevier Patient Education  2023 [employer] Post Anesthesia Home Care Instructions  Activity: Get plenty of rest  for the remainder of the day. A responsible individual must stay with you for 24 hours following the procedure.  For the next 24 hours, DO NOT: -Drive a car -Advertising copywriter -Drink alcoholic beverages -Take any medication unless instructed by  your physician -Make any legal decisions or sign important papers.  Meals: Start with liquid foods such as gelatin or soup. Progress to regular foods as tolerated. Avoid greasy, spicy, heavy foods. If nausea and/or vomiting occur, drink only clear liquids until the nausea and/or vomiting subsides. Call your physician if vomiting continues.  Special Instructions/Symptoms: Your throat may feel dry or sore from the anesthesia or the breathing tube placed in your throat during surgery. If this causes discomfort, gargle with warm salt water. The discomfort should disappear within 24 hours.

## 2022-03-02 NOTE — Anesthesia Preprocedure Evaluation (Signed)
Anesthesia Evaluation  Patient identified by MRN, date of birth, ID band Patient awake    Reviewed: Allergy & Precautions, NPO status , Patient's Chart, lab work & pertinent test results  Airway Mallampati: II  TM Distance: >3 FB Neck ROM: Full    Dental no notable dental hx.    Pulmonary neg pulmonary ROS,    Pulmonary exam normal breath sounds clear to auscultation       Cardiovascular negative cardio ROS Normal cardiovascular exam Rhythm:Regular Rate:Normal     Neuro/Psych negative neurological ROS  negative psych ROS   GI/Hepatic negative GI ROS, Neg liver ROS,   Endo/Other  negative endocrine ROS  Renal/GU negative Renal ROS  negative genitourinary   Musculoskeletal negative musculoskeletal ROS (+)   Abdominal   Peds negative pediatric ROS (+)  Hematology negative hematology ROS (+)   Anesthesia Other Findings BPH  Reproductive/Obstetrics negative OB ROS                             Anesthesia Physical Anesthesia Plan  ASA: 2  Anesthesia Plan: General   Post-op Pain Management: Minimal or no pain anticipated   Induction: Intravenous  PONV Risk Score and Plan: 2 and Ondansetron, Midazolam and Treatment may vary due to age or medical condition  Airway Management Planned: LMA  Additional Equipment:   Intra-op Plan:   Post-operative Plan: Extubation in OR  Informed Consent: I have reviewed the patients History and Physical, chart, labs and discussed the procedure including the risks, benefits and alternatives for the proposed anesthesia with the patient or authorized representative who has indicated his/her understanding and acceptance.     Dental advisory given  Plan Discussed with: CRNA  Anesthesia Plan Comments:         Anesthesia Quick Evaluation

## 2022-03-02 NOTE — Anesthesia Procedure Notes (Signed)
Procedure Name: LMA Insertion Date/Time: 03/02/2022 7:41 AM  Performed by: Dairl Ponder, CRNAPre-anesthesia Checklist: Patient identified, Emergency Drugs available, Suction available and Patient being monitored Patient Re-evaluated:Patient Re-evaluated prior to induction Oxygen Delivery Method: Circle System Utilized Preoxygenation: Pre-oxygenation with 100% oxygen Induction Type: IV induction Ventilation: Mask ventilation without difficulty LMA: LMA inserted LMA Size: 4.0 Number of attempts: 1 Airway Equipment and Method: Bite block Placement Confirmation: positive ETCO2 Tube secured with: Tape Dental Injury: Teeth and Oropharynx as per pre-operative assessment

## 2022-03-02 NOTE — Interval H&P Note (Signed)
History and Physical Interval Note:  03/02/2022 7:21 AM  Hayden Snyder  has presented today for surgery, with the diagnosis of URETHRAL STRICTURE.  The various methods of treatment have been discussed with the patient and family. After consideration of risks, benefits and other options for treatment, the patient has consented to  Procedure(s) with comments: OPTILUM BALLOON DILATION (N/A) - 43 MINS as a surgical intervention.  The patient's history has been reviewed, patient examined, no change in status, stable for surgery.  I have reviewed the patient's chart and labs.  He is well today.  I met with him in the preop area with the interpreter.  We went over again the nature risk benefits and alternatives to the OptiLube balloon including continued surveillance or urethroplasty.  We discussed common recurrence after balloon dilation.  We discussed flow symptoms typically improve but things like dysuria frequency and urgency can persist and that symptom we can deal with.  We discussed rare risk of fistula or incontinence among others.  We also discussed restrictions with condom for 30 days and contraception for 6 months.  We discussed the postop care and follow-up.  Foley care as well.  Questions were answered to the patient's satisfaction.  He elects to proceed.   Festus Aloe

## 2022-03-02 NOTE — Transfer of Care (Signed)
Immediate Anesthesia Transfer of Care Note  Patient: Hayden Snyder  Procedure(s) Performed: OPTILUM BALLOON DILATION (Urethra)  Patient Location: PACU  Anesthesia Type:General  Level of Consciousness: drowsy  Airway & Oxygen Therapy: Patient Spontanous Breathing  Post-op Assessment: Report given to RN and Post -op Vital signs reviewed and stable  Post vital signs: Reviewed and stable  Last Vitals:  Vitals Value Taken Time  BP 118/74 03/02/22 0827  Temp    Pulse    Resp 15 03/02/22 0830  SpO2    Vitals shown include unvalidated device data.  Last Pain:  Vitals:   03/02/22 0609  TempSrc: Oral  PainSc: 0-No pain      Patients Stated Pain Goal: 5 (03/02/22 6754)  Complications: No notable events documented.

## 2022-03-02 NOTE — Op Note (Signed)
Preoperative diagnosis: Urethral stricture Postoperative diagnosis: Same  Procedure: OptiLume balloon urethral stricture dilation  Surgeon: Mena Goes  Assistant: Gillian Scarce  Anesthesia: General  Indication for procedure: Hayden Snyder is a 41 year old male that had a history of weak stream and dysuria.  A tight stricture was noted on cystoscopy.  Findings: On exam the penis was circumcised without mass or lesion.  Testicles descended bilaterally and palpably normal.  On cystoscopy the penile urethra was a bit tight on the 22 [place] scope and there was a focal  2 cm bulbar urethral stricture about 8 [place].  Otherwise the prostatic urethra and bladder were unremarkable.  No stone or foreign body in the bladder.  No obvious mucosal lesion.  On dilation of the stricture with the dilators and the balloon it was quite dense and tight.  Description of procedure: After consent was obtained patient brought to the operating room.  He is placed in lithotomy position and prepped and draped in the usual sterile fashion.  Timeout was performed confirm the patient and procedure.  The rigid cystoscope was passed per urethra but was a bit tight so I swapped it out for the flexible cystoscope.  Focal stricture was noted in the bulbar urethra guidewire was passed and coiled in the bladder under fluoroscopic guidance.  We then serially dilated with a straight Bard tight urethral dilators from 12-18 [place].  We were then able to pass the flexible cystoscope along the wire through the stricture and inspect the proximal urethra, prostatic urethra and bladder.  We then came back and measured the stricture in the bulb at about 2 cm.  A 5 cm 30 [place] OPTi lume balloon was chosen and it was placed in the urethra over the guidewire to presoaked.  We followed it in with the flexible cystoscope.  It was then seated correctly over the urethral stricture and inflated.  Thought it might be slightly deep and therefore the balloon was deflated  backed out a few millimeters and then reinflated.  Visually and under fluoroscopic guidance a look like we had good stricture coverage.  The balloon was left inflated for 5 minutes and then deflated.  The balloon was removed without difficulty and then an 54 French Foley catheter was placed in left to gravity drainage.  Irrigation was clear and the catheter was in good position under fluoroscopic guidance.  He was then awakened taken recovery room in stable condition.  Complications: None  Blood loss: Minimal  Specimens: None  Drains: 18 French Foley catheter  Disposition: Patient stable to PACU -I discussed the procedure, follow-up and postop care with the patient's wife and cousin with the interpreter in the consultation room.

## 2022-03-02 NOTE — Anesthesia Postprocedure Evaluation (Signed)
Anesthesia Post Note  Patient: Deyon Alssir Moha Kulpa  Procedure(s) Performed: OPTILUM BALLOON DILATION (Urethra)     Patient location during evaluation: PACU Anesthesia Type: General Level of consciousness: awake and alert Pain management: pain level controlled Vital Signs Assessment: post-procedure vital signs reviewed and stable Respiratory status: spontaneous breathing, nonlabored ventilation and respiratory function stable Cardiovascular status: blood pressure returned to baseline and stable Postop Assessment: no apparent nausea or vomiting Anesthetic complications: no   No notable events documented.  Last Vitals:  Vitals:   03/02/22 0915 03/02/22 0935  BP: 118/79 116/78  Pulse:  67  Resp: 14 16  Temp: (!) 36.4 C 36.5 C  SpO2: 98% 97%    Last Pain:  Vitals:   03/02/22 1015  TempSrc:   PainSc: 4                  Lowella Curb

## 2022-03-05 ENCOUNTER — Encounter (HOSPITAL_BASED_OUTPATIENT_CLINIC_OR_DEPARTMENT_OTHER): Payer: Self-pay | Admitting: Urology

## 2022-03-06 DIAGNOSIS — N35011 Post-traumatic bulbous urethral stricture: Secondary | ICD-10-CM | POA: Diagnosis not present

## 2022-04-06 DIAGNOSIS — N35011 Post-traumatic bulbous urethral stricture: Secondary | ICD-10-CM | POA: Diagnosis not present

## 2022-06-17 DIAGNOSIS — M26609 Unspecified temporomandibular joint disorder, unspecified side: Secondary | ICD-10-CM | POA: Diagnosis not present

## 2022-06-17 DIAGNOSIS — H6121 Impacted cerumen, right ear: Secondary | ICD-10-CM | POA: Diagnosis not present

## 2022-06-22 ENCOUNTER — Emergency Department (HOSPITAL_COMMUNITY)
Admission: EM | Admit: 2022-06-22 | Discharge: 2022-06-22 | Disposition: A | Discharge status: Home / Self Care | Payer: BC Managed Care – PPO | Attending: Emergency Medicine | Admitting: Emergency Medicine

## 2022-06-22 ENCOUNTER — Other Ambulatory Visit: Payer: Self-pay

## 2022-06-22 DIAGNOSIS — R220 Localized swelling, mass and lump, head: Secondary | ICD-10-CM | POA: Diagnosis not present

## 2022-06-22 DIAGNOSIS — G510 Bell's palsy: Secondary | ICD-10-CM | POA: Insufficient documentation

## 2022-06-22 MED ORDER — PREDNISONE 10 MG PO TABS: 60.0000 mg | ORAL_TABLET | Freq: Every day | ORAL | 0 refills | Status: AC

## 2022-06-22 MED ORDER — VALACYCLOVIR HCL 1 G PO TABS: 1000.0000 mg | ORAL_TABLET | Freq: Three times a day (TID) | ORAL | 0 refills | Status: AC

## 2022-07-26 DIAGNOSIS — N35011 Post-traumatic bulbous urethral stricture: Secondary | ICD-10-CM | POA: Diagnosis not present

## 2023-03-03 IMAGING — CT CT RENAL STONE PROTOCOL
2 of 4 series · 17 of 46 positions shown, 19 images · non-contrast
Comparison: None.

CLINICAL DATA: Urinary retention.



[Series 2: stone full · axial · 0.74mm/px · z∈[+666,+1091]mm · 14 of 93 slices shown, 16 images]
[im 4/93  soft-tissue]
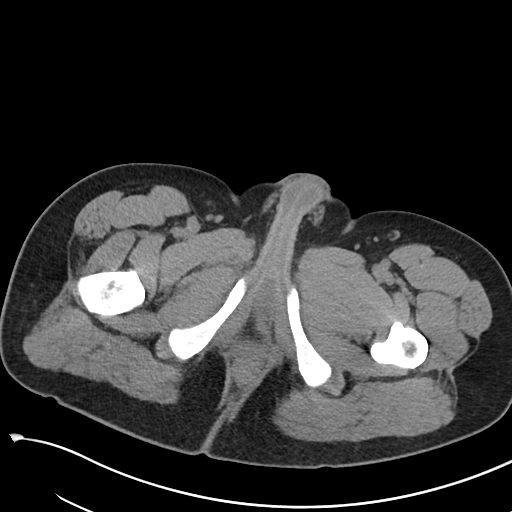
[im 4/93  bone]
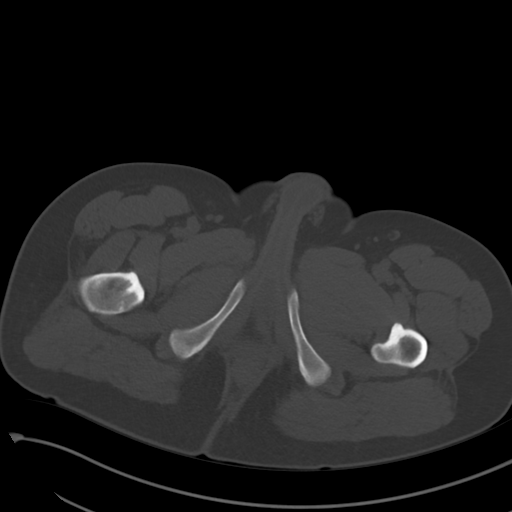
[im 11/93  soft-tissue]
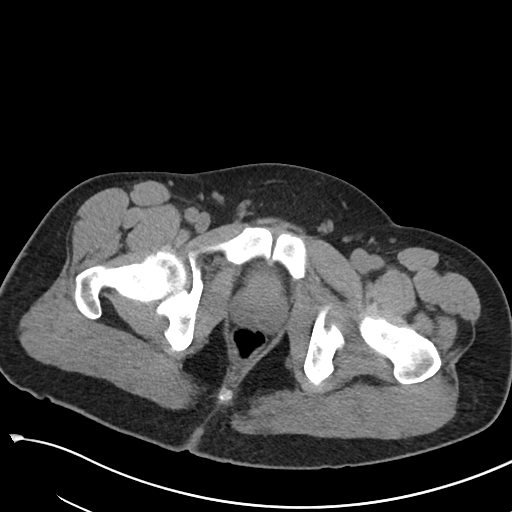
[im 18/93  soft-tissue]
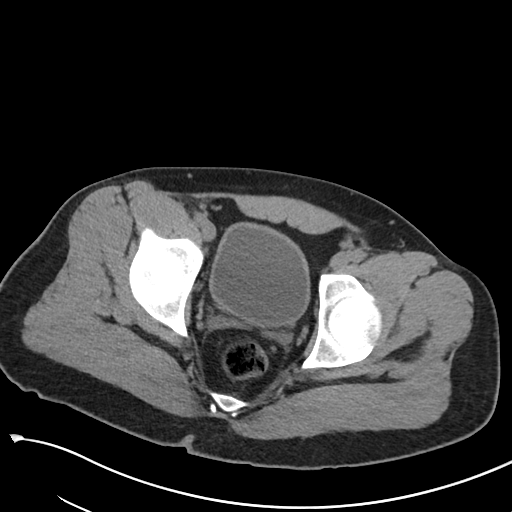
[im 25/93  soft-tissue]
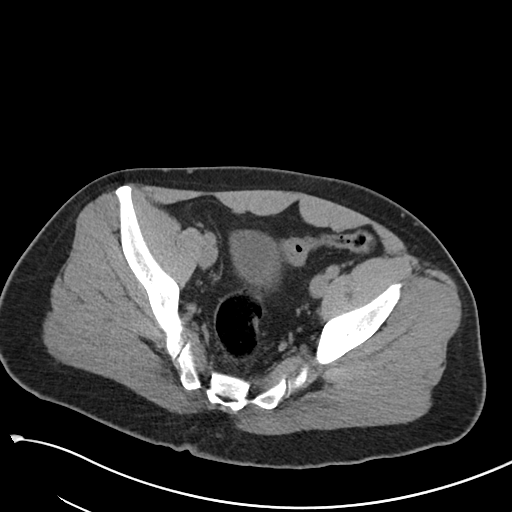
[im 32/93  soft-tissue]
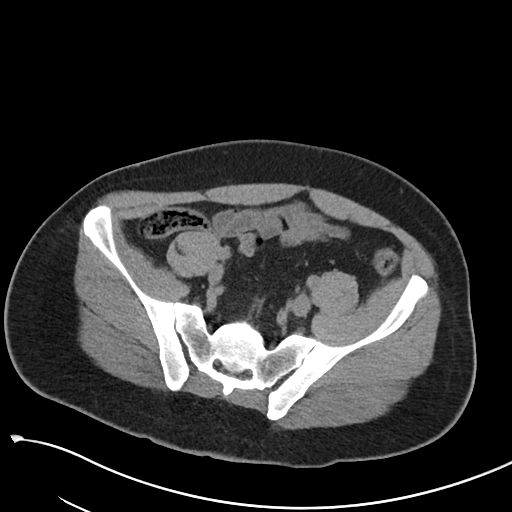
[im 36/93  soft-tissue]
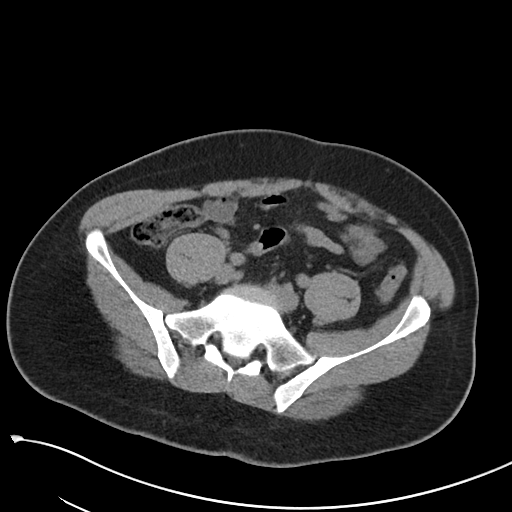
[im 43/93  soft-tissue]
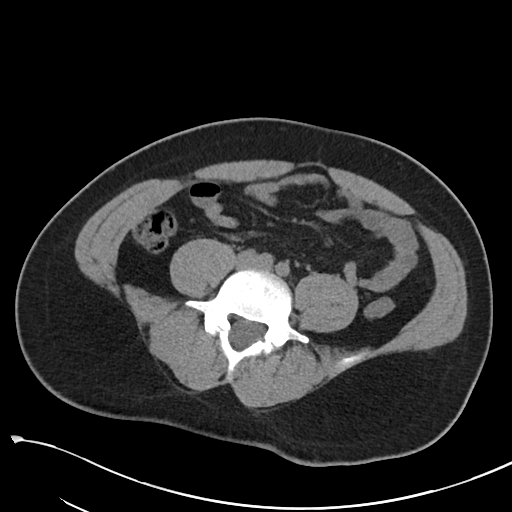
[im 50/93  soft-tissue]
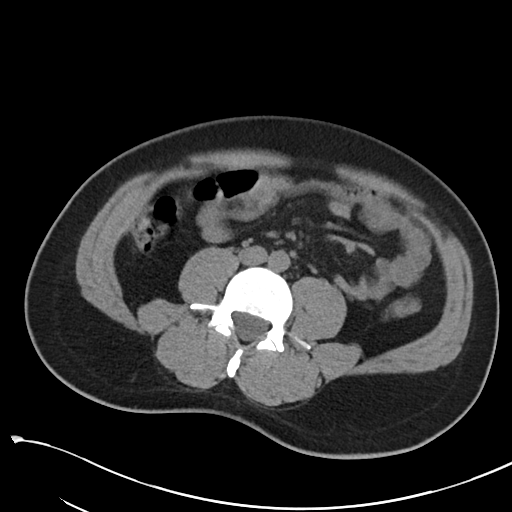
[im 57/93  soft-tissue]
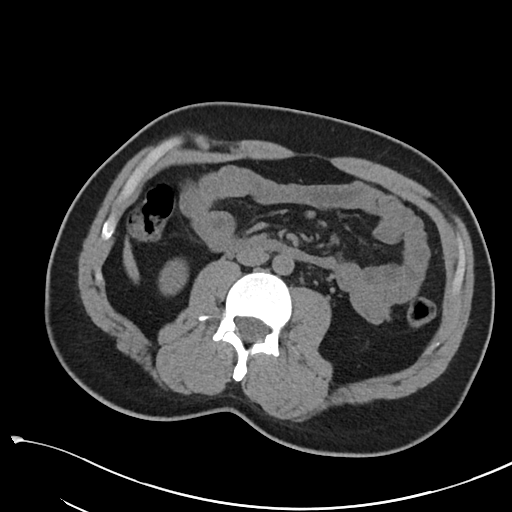
[im 57/93  bone]
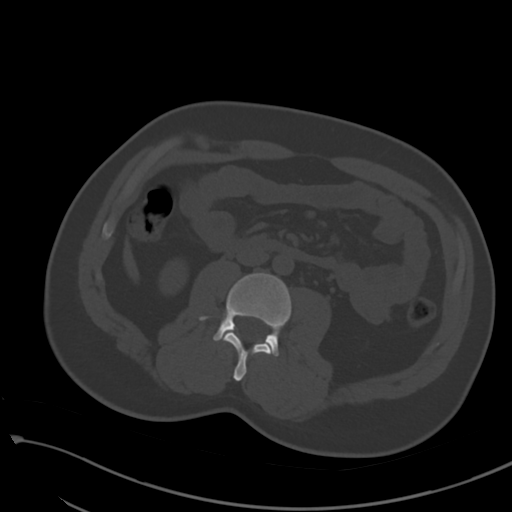
[im 61/93  soft-tissue]
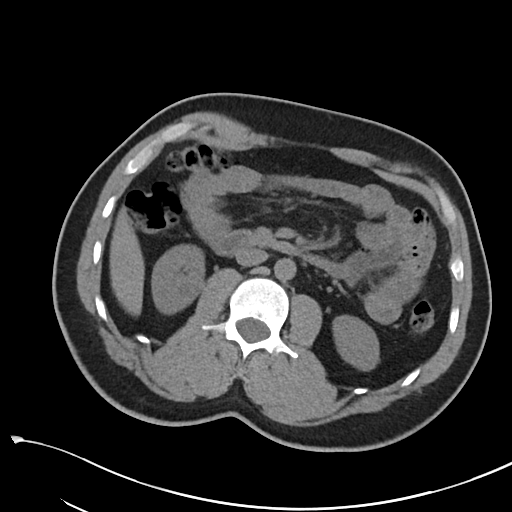
[im 68/93  soft-tissue]
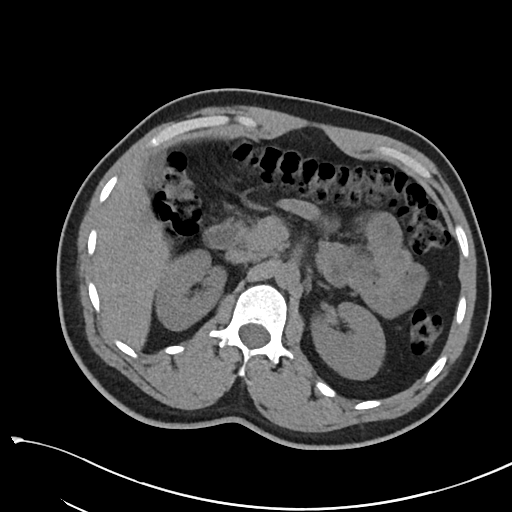
[im 75/93  soft-tissue]
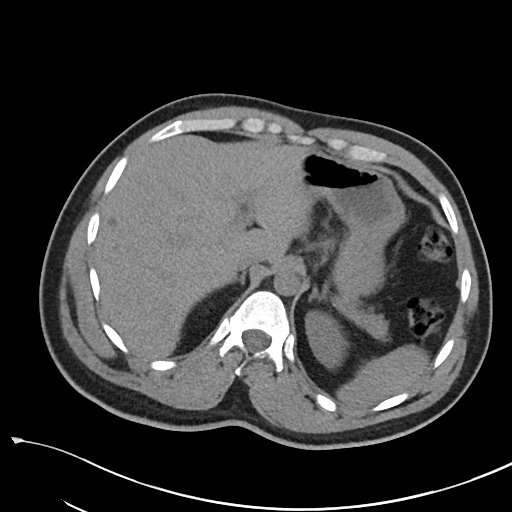
[im 82/93  soft-tissue]
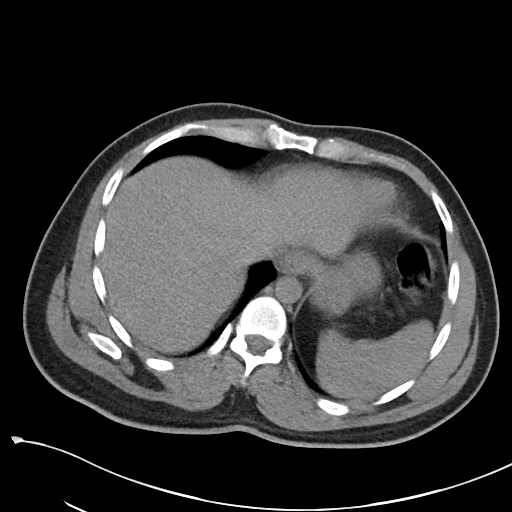
[im 89/93  soft-tissue]
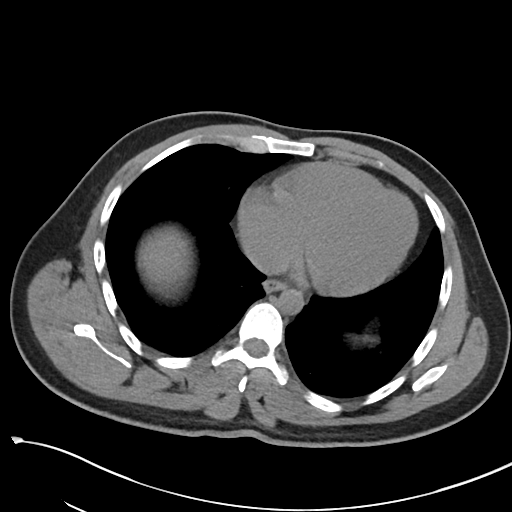

[Series 5: coronal · coronal · 0.84mm/px · 3 of 94 slices shown]
[im 32/94  soft-tissue]
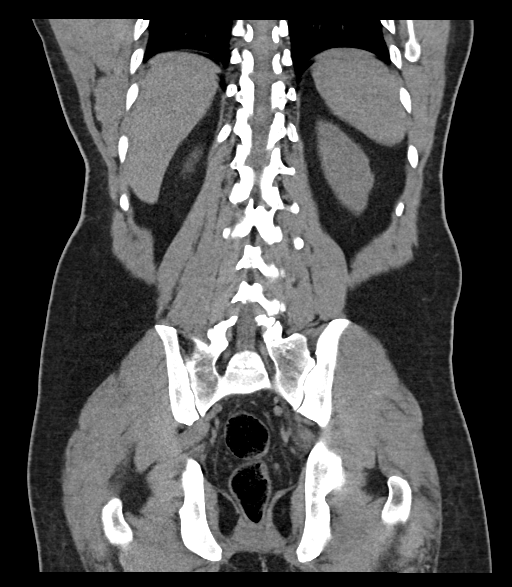
[im 42/94  soft-tissue]
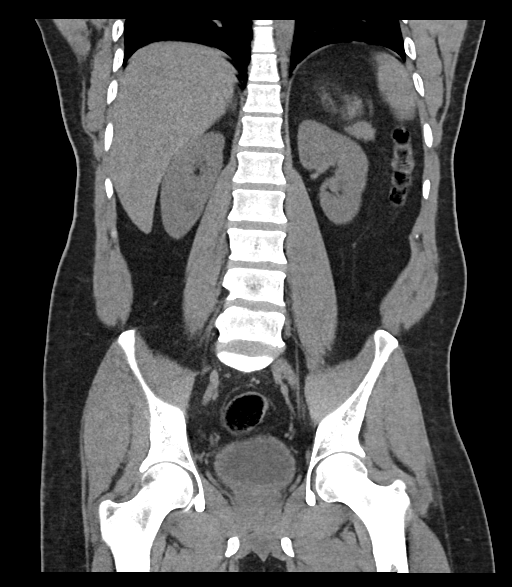
[im 52/94  soft-tissue]
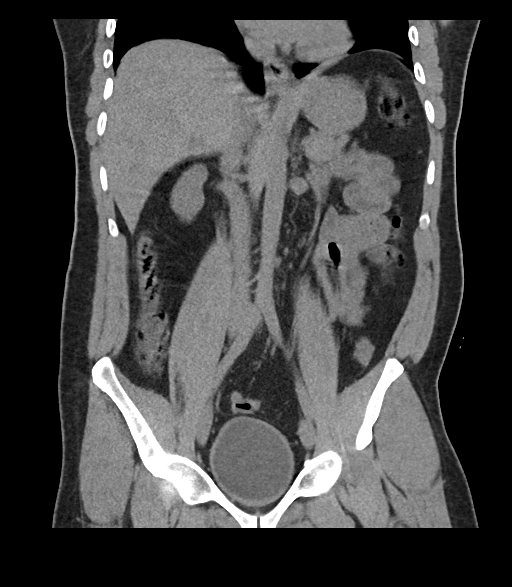

[17 of 46 positions shown; findings below may reference images not displayed]

FINDINGS: Lower chest: No acute abnormality.

Evaluation of the abdominal viscera is limited by the lack of IV
contrast.

Hepatobiliary: No focal liver abnormality is seen. Normal appearance
of the gallbladder.

Pancreas: Unremarkable. No surrounding inflammatory changes.

Spleen: Normal in size without focal abnormality.

Adrenals/Urinary Tract: Adrenal glands are unremarkable. Kidneys are
normal, without renal calculi, focal lesion, or hydronephrosis.
Bladder is unremarkable.

Stomach/Bowel: Stomach is within normal limits. Appendix appears
normal. No evidence of bowel wall thickening, distention, or
inflammatory changes.

Vascular/Lymphatic: No enlarged abdominal or pelvic lymph nodes.

Reproductive: Prostate is unremarkable.

Other: No abdominal wall hernia or abnormality. No abdominopelvic
ascites.

Musculoskeletal: Bilateral L5 pars defects.  No listhesis.
IMPRESSION: 1. No acute process in the abdomen or pelvis on a noncontrast exam.
2. Bilateral L5 pars defects without listhesis.

## 2023-09-20 DIAGNOSIS — R350 Frequency of micturition: Secondary | ICD-10-CM | POA: Diagnosis not present

## 2024-03-31 ENCOUNTER — Ambulatory Visit
Admission: EM | Admit: 2024-03-31 | Discharge: 2024-03-31 | Disposition: A | Discharge status: Home / Self Care | Attending: Family Medicine | Admitting: Family Medicine

## 2024-03-31 DIAGNOSIS — M5442 Lumbago with sciatica, left side: Secondary | ICD-10-CM

## 2024-03-31 MED ORDER — PREDNISONE 20 MG PO TABS: ORAL_TABLET | ORAL | 0 refills | Status: AC

## 2024-03-31 MED ORDER — CYCLOBENZAPRINE HCL 5 MG PO TABS: 5.0000 mg | ORAL_TABLET | Freq: Every evening | ORAL | 0 refills | Status: AC | PRN

## 2024-03-31 NOTE — ED Provider Notes (Signed)
 [facility]  Note:  This document was prepared using Conservation officer, historic buildings and may include unintentional dictation errors.  MRN: [id number] DOB: March 22, 1981  Subjective:   Hayden Snyder is a 43 y.o. male presenting for 2 day history of recurrent low back pain that radiates down the left leg. Pain is now constant, severe, sharp and shooting.  No fall, trauma, numbness or tingling, saddle paresthesia, changes to bowel or urinary habits. Has used Advil, ibuprofen, Tylenol  with minimal relief.   No current facility-administered medications for this encounter.  Current Outpatient Medications:    valACYclovir  (VALTREX ) 1000 MG tablet, Take 1 tablet (1,000 mg total) by mouth 3 (three) times daily., Disp: 21 tablet, Rfl: 0   Allergies  Allergen Reactions   Beef-Derived Drug Products    Pork-Derived Products     Past Medical History:  Diagnosis Date   COVID    Nov 2021 fever abd sire throat. 02/26/2022   Gonorrhea    had it when he was 43 yrs old and had a shot but didnt finish course of medication.  02/26/2022   Reflux esophagitis    sometimes with certain foods, ches gum to help with reflux. 02/26/2022     Past Surgical History:  Procedure Laterality Date   BALLOON DILATION N/A 03/02/2022   Procedure: OPTILUM BALLOON DILATION;  Surgeon: Nieves Cough, MD;  Location: [facility];  Service: Urology;  Laterality: N/A;  45 MINS    Family History  Problem Relation Age of Onset   Hypertension Father    Diabetes Father     Social History   Tobacco Use   Smoking status: Never   Smokeless tobacco: Never  Vaping Use   Vaping status: Never Used  Substance Use Topics   Alcohol use: Not Currently   Drug use: Not Currently    ROS   Objective:   Vitals: BP 117/77 (BP Location: Right Arm)   Pulse 67   Temp 98 F (36.7 C) (Oral)   Resp 20   SpO2 97%   Physical Exam Constitutional:      General: He is not in  acute distress.    Appearance: Normal appearance. He is well-developed and normal weight. He is not ill-appearing, toxic-appearing or diaphoretic.  HENT:     Head: Normocephalic and atraumatic.     Right Ear: External ear normal.     Left Ear: External ear normal.     Nose: Nose normal.     Mouth/Throat:     Pharynx: Oropharynx is clear.  Eyes:     General: No scleral icterus.       Right eye: No discharge.        Left eye: No discharge.     Extraocular Movements: Extraocular movements intact.  Cardiovascular:     Rate and Rhythm: Normal rate.  Pulmonary:     Effort: Pulmonary effort is normal.  Musculoskeletal:     Cervical back: Normal range of motion.     Lumbar back: Spasms and tenderness present. No swelling, edema, deformity, signs of trauma, lacerations or bony tenderness. Normal range of motion. Positive left straight leg raise test. Negative right straight leg raise test. No scoliosis.       Back:  Neurological:     Mental Status: He is alert and oriented to person, place, and time.  Psychiatric:        Mood and Affect: Mood normal.        Behavior: Behavior  normal.        Thought Content: Thought content normal.        Judgment: Judgment normal.     Assessment and Plan :   PDMP not reviewed this encounter.  1. Acute left-sided low back pain with left-sided sciatica    Not responding to otc NSAIDs and therefore given severity of his pain and interference with his work and daily activities will use prednisone  and muscle relaxant. Counseled patient on potential for adverse effects with medications prescribed/recommended today, ER and return-to-clinic precautions discussed, patient verbalized understanding.    Christopher Savannah, PA-C 03/31/24 1203

## 2024-03-31 NOTE — ED Triage Notes (Addendum)
 Pt c/o left side sciatica started yesterday-no pain meds PTA-NAD-steady slow gait
